# Patient Record
Sex: Female | Born: 1940 | Race: White | Hispanic: No | Marital: Single
Health system: Southern US, Community
[De-identification: ages and names within clinical notes are randomized; demographics above are authoritative.]

---

## 1999-01-10 ENCOUNTER — Ambulatory Visit (HOSPITAL_COMMUNITY): Admission: RE | Admit: 1999-01-10 | Discharge: 1999-01-10 | Payer: Self-pay | Admitting: Family Medicine

## 1999-05-09 ENCOUNTER — Encounter: Payer: Self-pay | Admitting: Vascular Surgery

## 1999-05-09 ENCOUNTER — Ambulatory Visit: Admission: RE | Admit: 1999-05-09 | Discharge: 1999-05-09 | Payer: Self-pay | Admitting: Vascular Surgery

## 1999-06-07 ENCOUNTER — Ambulatory Visit (HOSPITAL_COMMUNITY): Admission: RE | Admit: 1999-06-07 | Discharge: 1999-06-07 | Payer: Self-pay | Admitting: Vascular Surgery

## 1999-06-12 ENCOUNTER — Ambulatory Visit: Admission: RE | Admit: 1999-06-12 | Discharge: 1999-06-12 | Payer: Self-pay | Admitting: Vascular Surgery

## 1999-07-03 ENCOUNTER — Ambulatory Visit (HOSPITAL_COMMUNITY): Admission: RE | Admit: 1999-07-03 | Discharge: 1999-07-03 | Payer: Self-pay | Admitting: Vascular Surgery

## 1999-07-27 ENCOUNTER — Inpatient Hospital Stay (HOSPITAL_COMMUNITY): Admission: EM | Admit: 1999-07-27 | Discharge: 1999-08-01 | Payer: Self-pay | Admitting: *Deleted

## 1999-07-27 ENCOUNTER — Encounter: Payer: Self-pay | Admitting: *Deleted

## 1999-07-28 ENCOUNTER — Encounter: Payer: Self-pay | Admitting: Internal Medicine

## 1999-07-30 ENCOUNTER — Encounter: Payer: Self-pay | Admitting: Internal Medicine

## 1999-08-02 ENCOUNTER — Encounter: Admission: RE | Admit: 1999-08-02 | Discharge: 1999-10-31 | Payer: Self-pay | Admitting: Nephrology

## 1999-08-10 ENCOUNTER — Encounter: Admission: RE | Admit: 1999-08-10 | Discharge: 1999-11-08 | Payer: Self-pay | Admitting: Nephrology

## 1999-08-21 ENCOUNTER — Ambulatory Visit (HOSPITAL_COMMUNITY): Admission: RE | Admit: 1999-08-21 | Discharge: 1999-08-21 | Payer: Self-pay | Admitting: Family Medicine

## 1999-08-21 ENCOUNTER — Encounter: Payer: Self-pay | Admitting: Family Medicine

## 1999-08-27 ENCOUNTER — Encounter: Payer: Self-pay | Admitting: Emergency Medicine

## 1999-08-27 ENCOUNTER — Inpatient Hospital Stay (HOSPITAL_COMMUNITY): Admission: EM | Admit: 1999-08-27 | Discharge: 1999-08-31 | Payer: Self-pay | Admitting: Emergency Medicine

## 1999-08-28 ENCOUNTER — Encounter: Payer: Self-pay | Admitting: Nephrology

## 1999-11-16 ENCOUNTER — Ambulatory Visit (HOSPITAL_COMMUNITY): Admission: RE | Admit: 1999-11-16 | Discharge: 1999-11-16 | Payer: Self-pay | Admitting: Nephrology

## 1999-11-16 ENCOUNTER — Encounter: Payer: Self-pay | Admitting: Nephrology

## 2000-02-02 ENCOUNTER — Emergency Department (HOSPITAL_COMMUNITY): Admission: EM | Admit: 2000-02-02 | Discharge: 2000-02-02 | Payer: Self-pay | Admitting: *Deleted

## 2000-02-29 ENCOUNTER — Ambulatory Visit (HOSPITAL_COMMUNITY): Admission: RE | Admit: 2000-02-29 | Discharge: 2000-02-29 | Payer: Self-pay | Admitting: Vascular Surgery

## 2000-03-03 ENCOUNTER — Emergency Department (HOSPITAL_COMMUNITY): Admission: EM | Admit: 2000-03-03 | Discharge: 2000-03-04 | Payer: Self-pay | Admitting: Emergency Medicine

## 2000-03-20 ENCOUNTER — Emergency Department (HOSPITAL_COMMUNITY): Admission: EM | Admit: 2000-03-20 | Discharge: 2000-03-20 | Payer: Self-pay | Admitting: Emergency Medicine

## 2000-03-20 ENCOUNTER — Encounter: Payer: Self-pay | Admitting: Emergency Medicine

## 2000-05-12 ENCOUNTER — Ambulatory Visit (HOSPITAL_COMMUNITY): Admission: RE | Admit: 2000-05-12 | Discharge: 2000-05-12 | Payer: Self-pay | Admitting: Vascular Surgery

## 2000-05-12 ENCOUNTER — Encounter: Payer: Self-pay | Admitting: Vascular Surgery

## 2000-10-03 ENCOUNTER — Encounter: Admission: RE | Admit: 2000-10-03 | Discharge: 2000-10-03 | Payer: Self-pay | Admitting: *Deleted

## 2000-11-11 ENCOUNTER — Emergency Department (HOSPITAL_COMMUNITY): Admission: EM | Admit: 2000-11-11 | Discharge: 2000-11-11 | Payer: Self-pay

## 2000-11-20 ENCOUNTER — Inpatient Hospital Stay (HOSPITAL_COMMUNITY): Admission: EM | Admit: 2000-11-20 | Discharge: 2000-12-01 | Payer: Self-pay | Admitting: *Deleted

## 2000-12-06 ENCOUNTER — Encounter: Payer: Self-pay | Admitting: Nephrology

## 2000-12-06 ENCOUNTER — Inpatient Hospital Stay (HOSPITAL_COMMUNITY): Admission: EM | Admit: 2000-12-06 | Discharge: 2000-12-12 | Payer: Self-pay | Admitting: Emergency Medicine

## 2000-12-19 ENCOUNTER — Other Ambulatory Visit: Admission: RE | Admit: 2000-12-19 | Discharge: 2000-12-19 | Payer: Self-pay | Admitting: Nephrology

## 2000-12-19 ENCOUNTER — Encounter: Admission: RE | Admit: 2000-12-19 | Discharge: 2000-12-19 | Payer: Self-pay | Admitting: Nephrology

## 2000-12-19 ENCOUNTER — Encounter: Payer: Self-pay | Admitting: Nephrology

## 2000-12-25 ENCOUNTER — Encounter: Payer: Self-pay | Admitting: General Surgery

## 2000-12-25 ENCOUNTER — Encounter: Admission: RE | Admit: 2000-12-25 | Discharge: 2000-12-25 | Payer: Self-pay | Admitting: General Surgery

## 2001-01-08 ENCOUNTER — Inpatient Hospital Stay (HOSPITAL_COMMUNITY): Admission: AD | Admit: 2001-01-08 | Discharge: 2001-01-13 | Payer: Self-pay | Admitting: General Surgery

## 2001-01-11 ENCOUNTER — Encounter: Payer: Self-pay | Admitting: Nephrology

## 2001-01-26 ENCOUNTER — Encounter: Admission: RE | Admit: 2001-01-26 | Discharge: 2001-01-26 | Payer: Self-pay | Admitting: Gastroenterology

## 2001-01-26 ENCOUNTER — Encounter: Payer: Self-pay | Admitting: Gastroenterology

## 2001-02-10 ENCOUNTER — Encounter: Payer: Self-pay | Admitting: General Surgery

## 2001-02-11 ENCOUNTER — Inpatient Hospital Stay (HOSPITAL_COMMUNITY): Admission: RE | Admit: 2001-02-11 | Discharge: 2001-02-11 | Payer: Self-pay | Admitting: General Surgery

## 2001-11-10 ENCOUNTER — Inpatient Hospital Stay (HOSPITAL_COMMUNITY): Admission: EM | Admit: 2001-11-10 | Discharge: 2001-11-10 | Payer: Self-pay | Admitting: Emergency Medicine

## 2001-11-10 ENCOUNTER — Encounter: Payer: Self-pay | Admitting: Nephrology

## 2001-11-10 ENCOUNTER — Encounter: Payer: Self-pay | Admitting: Emergency Medicine

## 2001-12-21 ENCOUNTER — Encounter: Admission: RE | Admit: 2001-12-21 | Discharge: 2001-12-21 | Payer: Self-pay | Admitting: Family Medicine

## 2001-12-21 ENCOUNTER — Encounter: Payer: Self-pay | Admitting: Family Medicine

## 2001-12-23 ENCOUNTER — Ambulatory Visit (HOSPITAL_COMMUNITY): Admission: RE | Admit: 2001-12-23 | Discharge: 2001-12-23 | Payer: Self-pay | Admitting: Interventional Cardiology

## 2001-12-23 ENCOUNTER — Encounter: Payer: Self-pay | Admitting: Interventional Cardiology

## 2002-01-13 ENCOUNTER — Ambulatory Visit (HOSPITAL_COMMUNITY): Admission: RE | Admit: 2002-01-13 | Discharge: 2002-01-13 | Payer: Self-pay | Admitting: *Deleted

## 2002-01-17 ENCOUNTER — Encounter: Payer: Self-pay | Admitting: *Deleted

## 2002-01-18 ENCOUNTER — Encounter: Payer: Self-pay | Admitting: Nephrology

## 2002-01-18 ENCOUNTER — Inpatient Hospital Stay (HOSPITAL_COMMUNITY): Admission: EM | Admit: 2002-01-18 | Discharge: 2002-01-29 | Payer: Self-pay | Admitting: *Deleted

## 2002-01-20 ENCOUNTER — Encounter: Payer: Self-pay | Admitting: Nephrology

## 2002-02-13 ENCOUNTER — Encounter: Payer: Self-pay | Admitting: Nephrology

## 2002-02-13 ENCOUNTER — Inpatient Hospital Stay (HOSPITAL_COMMUNITY): Admission: AD | Admit: 2002-02-13 | Discharge: 2002-02-15 | Payer: Self-pay | Admitting: Nephrology

## 2002-04-15 ENCOUNTER — Ambulatory Visit (HOSPITAL_COMMUNITY): Admission: RE | Admit: 2002-04-15 | Discharge: 2002-04-15 | Payer: Self-pay | Admitting: *Deleted

## 2002-04-16 ENCOUNTER — Encounter: Admission: RE | Admit: 2002-04-16 | Discharge: 2002-04-16 | Payer: Self-pay | Admitting: Nephrology

## 2002-04-16 ENCOUNTER — Encounter: Payer: Self-pay | Admitting: Nephrology

## 2002-05-13 ENCOUNTER — Encounter: Payer: Self-pay | Admitting: Vascular Surgery

## 2002-05-13 ENCOUNTER — Ambulatory Visit (HOSPITAL_COMMUNITY): Admission: RE | Admit: 2002-05-13 | Discharge: 2002-05-13 | Payer: Self-pay | Admitting: Nephrology

## 2002-07-19 ENCOUNTER — Ambulatory Visit (HOSPITAL_COMMUNITY): Admission: RE | Admit: 2002-07-19 | Discharge: 2002-07-19 | Payer: Self-pay | Admitting: Vascular Surgery

## 2002-09-30 ENCOUNTER — Inpatient Hospital Stay (HOSPITAL_COMMUNITY): Admission: AD | Admit: 2002-09-30 | Discharge: 2002-10-05 | Payer: Self-pay | Admitting: Nephrology

## 2002-09-30 ENCOUNTER — Encounter: Payer: Self-pay | Admitting: Nephrology

## 2002-10-04 ENCOUNTER — Encounter: Payer: Self-pay | Admitting: Nephrology

## 2002-10-13 ENCOUNTER — Ambulatory Visit (HOSPITAL_COMMUNITY): Admission: RE | Admit: 2002-10-13 | Discharge: 2002-10-13 | Payer: Self-pay

## 2002-10-21 ENCOUNTER — Inpatient Hospital Stay (HOSPITAL_COMMUNITY): Admission: EM | Admit: 2002-10-21 | Discharge: 2002-10-24 | Payer: Self-pay | Admitting: Emergency Medicine

## 2002-10-21 ENCOUNTER — Encounter: Payer: Self-pay | Admitting: Nephrology

## 2002-10-21 ENCOUNTER — Encounter: Payer: Self-pay | Admitting: Internal Medicine

## 2002-11-05 ENCOUNTER — Encounter: Admission: RE | Admit: 2002-11-05 | Discharge: 2002-11-05 | Payer: Self-pay | Admitting: Nephrology

## 2002-11-05 ENCOUNTER — Encounter: Payer: Self-pay | Admitting: Nephrology

## 2003-01-04 ENCOUNTER — Ambulatory Visit (HOSPITAL_COMMUNITY): Admission: RE | Admit: 2003-01-04 | Discharge: 2003-01-04 | Payer: Self-pay | Admitting: *Deleted

## 2003-01-04 ENCOUNTER — Encounter: Payer: Self-pay | Admitting: *Deleted

## 2003-03-23 ENCOUNTER — Encounter: Payer: Self-pay | Admitting: Nephrology

## 2003-03-23 ENCOUNTER — Encounter: Admission: RE | Admit: 2003-03-23 | Discharge: 2003-03-23 | Payer: Self-pay | Admitting: Nephrology

## 2003-07-04 ENCOUNTER — Ambulatory Visit (HOSPITAL_COMMUNITY): Admission: RE | Admit: 2003-07-04 | Discharge: 2003-07-04 | Payer: Self-pay | Admitting: Nephrology

## 2003-08-03 ENCOUNTER — Encounter: Admission: RE | Admit: 2003-08-03 | Discharge: 2003-08-03 | Payer: Self-pay | Admitting: Nephrology

## 2003-08-04 ENCOUNTER — Encounter: Admission: RE | Admit: 2003-08-04 | Discharge: 2003-08-04 | Payer: Self-pay | Admitting: Nephrology

## 2003-10-09 ENCOUNTER — Inpatient Hospital Stay (HOSPITAL_COMMUNITY): Admission: EM | Admit: 2003-10-09 | Discharge: 2003-10-10 | Payer: Self-pay | Admitting: Emergency Medicine

## 2003-10-26 ENCOUNTER — Ambulatory Visit (HOSPITAL_COMMUNITY): Admission: RE | Admit: 2003-10-26 | Discharge: 2003-10-26 | Payer: Self-pay | Admitting: Nephrology

## 2003-12-07 ENCOUNTER — Ambulatory Visit (HOSPITAL_COMMUNITY): Admission: RE | Admit: 2003-12-07 | Discharge: 2003-12-07 | Payer: Self-pay | Admitting: Nephrology

## 2004-04-23 ENCOUNTER — Ambulatory Visit (HOSPITAL_COMMUNITY): Admission: RE | Admit: 2004-04-23 | Discharge: 2004-04-23 | Payer: Self-pay | Admitting: Internal Medicine

## 2004-07-20 ENCOUNTER — Ambulatory Visit (HOSPITAL_COMMUNITY): Admission: RE | Admit: 2004-07-20 | Discharge: 2004-07-20 | Payer: Self-pay | Admitting: Nephrology

## 2004-08-22 ENCOUNTER — Ambulatory Visit (HOSPITAL_COMMUNITY): Admission: RE | Admit: 2004-08-22 | Discharge: 2004-08-22 | Payer: Self-pay | Admitting: Internal Medicine

## 2004-10-20 ENCOUNTER — Inpatient Hospital Stay (HOSPITAL_COMMUNITY): Admission: EM | Admit: 2004-10-20 | Discharge: 2004-10-23 | Payer: Self-pay | Admitting: Emergency Medicine

## 2004-10-29 ENCOUNTER — Inpatient Hospital Stay (HOSPITAL_COMMUNITY): Admission: EM | Admit: 2004-10-29 | Discharge: 2004-11-12 | Payer: Self-pay | Admitting: Emergency Medicine

## 2004-11-15 ENCOUNTER — Emergency Department (HOSPITAL_COMMUNITY): Admission: EM | Admit: 2004-11-15 | Discharge: 2004-11-15 | Payer: Self-pay | Admitting: Emergency Medicine

## 2005-10-22 IMAGING — NM NM GASTRIC EMPTYING
7 series · 7 of 7 positions shown · non-contrast
Comparison: none

CLINICAL DATA: 62-year-old with nausea and vomiting.  History of diabetes. 
GASTRIC EMPTYING STUDY  
2.0 mCi TcSc was mixed with egg and given to the patient orally.  Images were acquired from 0 to 120 minutes after ingestion.  There is no significant gastric emptying during the exam.  There is poor esophageal clearing after administration of the egg which suggests poor esophageal peristalsis or gastroesophageal reflux. Less likely, the findings could be related to distal esophageal stricture.
IMPRESSION
1.  No significant gastric emptying for 120 minutes. 
2.  Poor esophageal clearing.

[Series 1: ge gastric emptying · 2.35mm/px · 1 of 1 slices shown (1 of 7)]
[im 1/1]
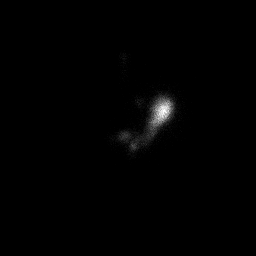

[Series 1: ge gastric emptying · 2.35mm/px · 1 of 1 slices shown (2 of 7)]
[im 1/1]
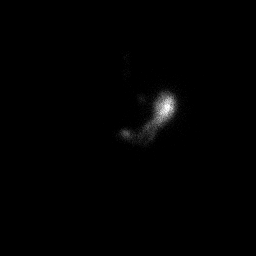

[Series 1: ge gastric emptying · 2.35mm/px · 1 of 1 slices shown (3 of 7)]
[im 1/1]
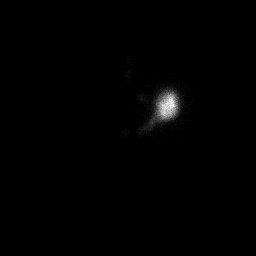

[Series 1: ge gastric emptying · 2.35mm/px · 1 of 1 slices shown (4 of 7)]
[im 1/1]
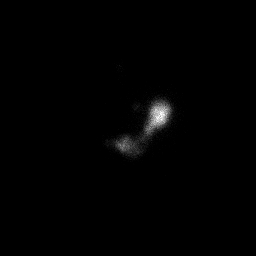

[Series 1: ge gastric emptying · 2.35mm/px · 1 of 1 slices shown (5 of 7)]
[im 1/1]
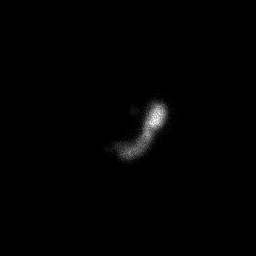

[Series 1: ge gastric emptying · 2.35mm/px · 1 of 1 slices shown (6 of 7)]
[im 1/1]
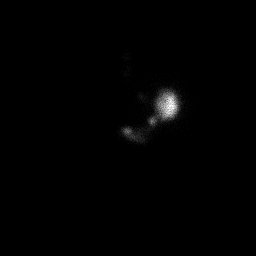

[Series 1: ge gastric emptying · 2.35mm/px · 1 of 1 slices shown (7 of 7)]
[im 1/1]
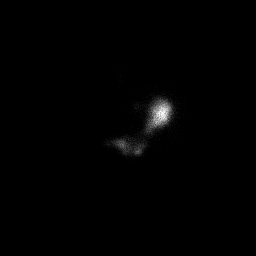

[7 of 7 positions shown; findings below may reference images not displayed]

## 2010-07-21 ENCOUNTER — Encounter: Payer: Self-pay | Admitting: Nephrology

## 2014-12-26 ENCOUNTER — Telehealth: Payer: Self-pay | Admitting: Pain Medicine

## 2014-12-27 NOTE — Telephone Encounter (Signed)
Pt left msg at 3:48 mon 12-26-14   Her pcp Dr. Maryellen PileEason put her on Bupropion HCL SR 150 mg for anxiety Wanted to let dr crisp know.

## 2014-12-27 NOTE — Telephone Encounter (Signed)
Nurses and staff Thank you for the follow-up You may schedule shoulder injections for Wednesday, July 13. Patient wishes to do such

## 2014-12-27 NOTE — Telephone Encounter (Signed)
Nurses and Staff'  Thank you for the follow-up You may schedule shoulder injections for Wednesday, July 13 if patient wishes to have shoulder injections on Wednesday, 01/11/2015

## 2014-12-28 NOTE — Telephone Encounter (Signed)
Pt is sched 01-11-15 at 9:30, she has been notified/ 12-28-14 10:00

## 2015-01-11 ENCOUNTER — Ambulatory Visit: Payer: Self-pay | Admitting: Pain Medicine
